# Patient Record
Sex: Male | Born: 1995 | Hispanic: No | Marital: Single | Smoking: Never smoker
Health system: Southern US, Community
[De-identification: ages and names within clinical notes are randomized; demographics above are authoritative.]

## PROBLEM LIST (undated history)

## (undated) DIAGNOSIS — J069 Acute upper respiratory infection, unspecified: Secondary | ICD-10-CM

## (undated) DIAGNOSIS — L309 Dermatitis, unspecified: Secondary | ICD-10-CM

## (undated) HISTORY — DX: Acute upper respiratory infection, unspecified: J06.9

## (undated) HISTORY — DX: Dermatitis, unspecified: L30.9

---
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
## 2016-01-29 ENCOUNTER — Ambulatory Visit
Admission: RE | Admit: 2016-01-29 | Discharge: 2016-01-29 | Disposition: A | Discharge status: Home / Self Care | Payer: 59 | Source: Ambulatory Visit | Attending: Family Medicine | Admitting: Family Medicine

## 2016-01-29 ENCOUNTER — Other Ambulatory Visit: Payer: Self-pay | Admitting: Family Medicine

## 2016-01-29 DIAGNOSIS — S0341XA Sprain of jaw, right side, initial encounter: Secondary | ICD-10-CM

## 2017-11-08 ENCOUNTER — Ambulatory Visit
Admission: RE | Admit: 2017-11-08 | Discharge: 2017-11-08 | Disposition: A | Discharge status: Home / Self Care | Payer: 59 | Source: Ambulatory Visit | Attending: Family Medicine | Admitting: Family Medicine

## 2017-11-08 ENCOUNTER — Other Ambulatory Visit: Payer: Self-pay | Admitting: Family Medicine

## 2017-11-08 DIAGNOSIS — R059 Cough, unspecified: Secondary | ICD-10-CM

## 2017-11-08 DIAGNOSIS — R05 Cough: Secondary | ICD-10-CM

## 2017-12-23 ENCOUNTER — Ambulatory Visit (INDEPENDENT_AMBULATORY_CARE_PROVIDER_SITE_OTHER): Payer: 59 | Admitting: Allergy

## 2017-12-23 ENCOUNTER — Encounter: Payer: Self-pay | Admitting: Allergy

## 2017-12-23 VITALS — BP 120/90 | HR 97 | Temp 98.0°F | Resp 16 | Ht 68.0 in | Wt 143.2 lb

## 2017-12-23 DIAGNOSIS — L2089 Other atopic dermatitis: Secondary | ICD-10-CM | POA: Diagnosis not present

## 2017-12-23 DIAGNOSIS — H1010 Acute atopic conjunctivitis, unspecified eye: Secondary | ICD-10-CM | POA: Diagnosis not present

## 2017-12-23 DIAGNOSIS — J45991 Cough variant asthma: Secondary | ICD-10-CM | POA: Diagnosis not present

## 2017-12-23 DIAGNOSIS — J309 Allergic rhinitis, unspecified: Secondary | ICD-10-CM

## 2017-12-23 DIAGNOSIS — K219 Gastro-esophageal reflux disease without esophagitis: Secondary | ICD-10-CM

## 2017-12-23 MED ORDER — TRIAMCINOLONE ACETONIDE 0.1 % EX OINT: 1.0000 "application " | TOPICAL_OINTMENT | Freq: Two times a day (BID) | CUTANEOUS | 5 refills | Status: AC

## 2017-12-23 MED ORDER — AZELASTINE-FLUTICASONE 137-50 MCG/ACT NA SUSP: 1.0000 | Freq: Two times a day (BID) | NASAL | 5 refills | Status: AC

## 2017-12-23 MED ORDER — MONTELUKAST SODIUM 10 MG PO TABS: 10.0000 mg | ORAL_TABLET | Freq: Every day | ORAL | 5 refills | Status: AC

## 2017-12-23 MED ORDER — ALBUTEROL SULFATE HFA 108 (90 BASE) MCG/ACT IN AERS: 2.0000 | INHALATION_SPRAY | RESPIRATORY_TRACT | 1 refills | Status: AC | PRN

## 2017-12-23 NOTE — Progress Notes (Signed)
New Patient Note  RE: Alfonso RamusRahul Grandt MRN: [id number] DOB: 1995/11/02 Date of Office Visit: 12/23/2017  Referring provider: Tally JoeSwayne, David, MD Primary care provider: Tally JoeSwayne, David, MD  Chief Complaint: Cough  History of present illness: Doral Greggory StallionGeorge is a 22 y.o. male presenting today for consultation for chronic cough and hoarseness.  For the past 2-3 years he has been experiencing a lingering cough.  He also reports post-nasal drip.  He does feel like it is a little bit harder to breathe at times and also has noted wheeze and chest tightness. Also notes a hoarse voice.  The symptoms are slightly worse in the mornings but can occur throughout the day.  The cough is dry and nonproductive.  He sometimes this would like there is thick mucus in the back of his throat that is hard to clear.  No nighttime awakenings.  He has not tried use of albuterol to see if it would relieve the difficulty breathing, chest tightness and wheeze. He states he sleeps elevated to help with the drainage.  It was recommended by his PCP to take nexium daily as he does have reflux.   He has also taking zyrtec daily and using Flonase 2 sprays each nostril daily.  He has been off Zyrtec for last week and does not feel he has noted any worsening of symptoms.  He states he has been on Zyrtec for years.  He does report symptoms of watery eyes, sneezing and congestion with pollen exposure.  He states he had skin testing in middle school and recalls being positive to pollens and cat.  He has never undergone immunotherapy.  He denies any history of food allergy.  He does have history of eczema but states it was much worse when he was younger.     Review of systems: Review of Systems  Constitutional: Negative for chills, fever and malaise/fatigue.  HENT: Positive for congestion. Negative for ear discharge, ear pain, nosebleeds, sinus pain and sore throat.   Eyes: Negative for pain, discharge and redness.  Respiratory:  Positive for cough, shortness of breath and wheezing. Negative for hemoptysis and sputum production.   Cardiovascular: Negative for chest pain.  Gastrointestinal: Negative for abdominal pain, constipation, diarrhea, heartburn, nausea and vomiting.  Musculoskeletal: Negative for joint pain.  Skin: Positive for itching and rash.  Neurological: Negative for headaches.    All other systems negative unless noted above in HPI  Past medical history: Past Medical History:  Diagnosis Date  . Eczema   . Recurrent upper respiratory infection (URI)     Past surgical history: History reviewed. No pertinent surgical history.  Family history:  Family History  Problem Relation Age of Onset  . Asthma Father   . Eczema Father     Social history: He lives in a home with carpeting with gas heating and central cooling.  There are no pets in the home.  There is no concern for water damage, mildew or roaches in the home.  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  He denies any smoking history  Medication List: Allergies as of 12/23/2017      Reactions   Penicillins       Medication List        Accurate as of 12/23/17  5:00 PM. Always use your most recent med list.          cetirizine 10 MG tablet Commonly known as:  ZYRTEC Take 10 mg by mouth daily.   esomeprazole 20  MG capsule Commonly known as:  NEXIUM Take 20 mg by mouth daily at 12 noon.   fluticasone 50 MCG/ACT nasal spray Commonly known as:  FLONASE Place 2 sprays into both nostrils daily.       Known medication allergies: Allergies  Allergen Reactions  . Penicillins      Physical examination: Blood pressure 120/90, pulse 97, temperature 98 F (36.7 C), resp. rate 16, height 5\' 8"  (1.727 m), weight 143 lb 3.2 oz (65 kg), SpO2 96 %.  General: Alert, interactive, in no acute distress. HEENT: PERRLA, TMs pearly gray, turbinates moderately edematous with clear discharge, post-pharynx non  erythematous. Neck: Supple without lymphadenopathy. Lungs: Clear to auscultation without wheezing, rhonchi or rales. {no increased work of breathing. CV: Normal S1, S2 without murmurs. Abdomen: Nondistended, nontender. Skin: Dry, erythematous, excoriated patches on the Left axilla extending posteriorly to the back.  Skin is overall dry and feels lichenified on the back. Extremities:  No clubbing, cyanosis or edema. Neuro:   Grossly intact.  Diagnositics/Labs:  Spirometry: FEV1: 2.95L 67%, FVC: 3.65L 70% postbronchodilator she did not have any significant improvement.  This was his first attempt at spirometry.  He did not have great effort on either pre-or post attempts.  Allergy testing: Environmental allergy skin prick testing is positive to grasses, weeds, trees, molds, dust mites, cat, cockroach Allergy testing results were read and interpreted by provider, documented by clinical staff.   Assessment and plan:   Allergic rhinoconjunctivitis   - environmental allergy skin testing today is positive to grasses, weeds, trees, mold, dust mites, cat and cockroach   - allergen avoidance measures provided today.  Recommend performing avoidance measures to decrease exposure and symptoms   - stop zyrtec and trial either Allegra 180mg  or Xyzal 5mg  daily   - trial dymista 1 spray each nostril twice a day.  This is a combination nasal spray with Flonase + Astelin (nasal antihistamine).  This helps with both nasal congestion and drainage.  dymista is not well covered by insurance and thus if not covered will prescribe Astelin separately as a prescription (Astelin 2 sprays each nostril twice a day).     - start singulair 10mg  daily - take in evening/bedtime   - you are eligible for allergen immunotherapy if above medications do not control symptoms well-enough.  We can discuss this at a future visit.   Cough   - cough is likely multifactorial with post-nasal drainage and reflux being contributors as  well as likely allergic or cough variant asthma with difficulty breathing, wheeze and chest tightness.     - have access to albuterol inhaler 2 puffs every 4-6 hours as needed for cough/wheeze/shortness of breath/chest tightness.  May use 15-20 minutes prior to activity.   Monitor frequency of use.     - as above start singulair daily  Reflux   - continue daily use of Nexium   - avoid foods that worsening reflux   - continue increase head of bed to help decrease reflux symptoms  Eczema    - daily moisturization is key with emollients like Aquafor, Eucerin, CeraVe, Vaseline   - can use triamcinolone ointment to dry/patchy/red/inflamed areas twice a day until improved  Follow-up 3 months or sooner if needed  I appreciate the opportunity to take part in Furqan's care. Please do not hesitate to contact me with questions.  Sincerely,   Margo Aye, MD Allergy/Immunology [facility]

## 2017-12-23 NOTE — Patient Instructions (Signed)
Allergic rhinoconjunctivitis   - environmental allergy skin testing today is positive to grasses, weeds, trees, mold, dust mites, cat and cockroach   - allergen avoidance measures provided today.  Recommend performing avoidance measures to decrease exposure and symptoms   - stop zyrtec and trial either Allegra 180mg  or Xyzal 5mg  daily   - trial dymista 1 spray each nostril twice a day.  This is a combination nasal spray with Flonase + Astelin (nasal antihistamine).  This helps with both nasal congestion and drainage.  dymista is not well covered by insurance and thus if not covered will prescribe Astelin separately as a prescription (Astelin 2 sprays each nostril twice a day).     - start singulair 10mg  daily - take in evening/bedtime   - you are eligible for allergen immunotherapy if above medications do not control symptoms well-enough.  We can discuss this at a future visit.   Cough   - cough is likely multifactorial with post-nasal drainage and reflux being contributors as well as likely allergic asthma with difficulty breathing, wheeze and chest tightness.     - have access to albuterol inhaler 2 puffs every 4-6 hours as needed for cough/wheeze/shortness of breath/chest tightness.  May use 15-20 minutes prior to activity.   Monitor frequency of use.     - as above start singulair daily  Reflux   - continue daily use of Nexium   - avoid foods that worsening reflux   - continue increase head of bed to help decrease reflux symptoms  Eczema    - daily moisturization is key with emollients like Aquafor, Eucerin, CeraVe, Vaseline   - can use triamcinolone ointment to dry/patchy/red/inflamed areas twice a day until improved  Follow-up 3 months or sooner if needed

## 2018-01-26 ENCOUNTER — Encounter: Payer: Self-pay | Admitting: Allergy

## 2020-03-01 IMAGING — CR DG CHEST 2V
2 series · 2 of 2 positions shown · non-contrast
Comparison: None.

CLINICAL DATA: Productive cough for 1 year.

EXAM:
CHEST - 2 VIEW

[w chest pa]
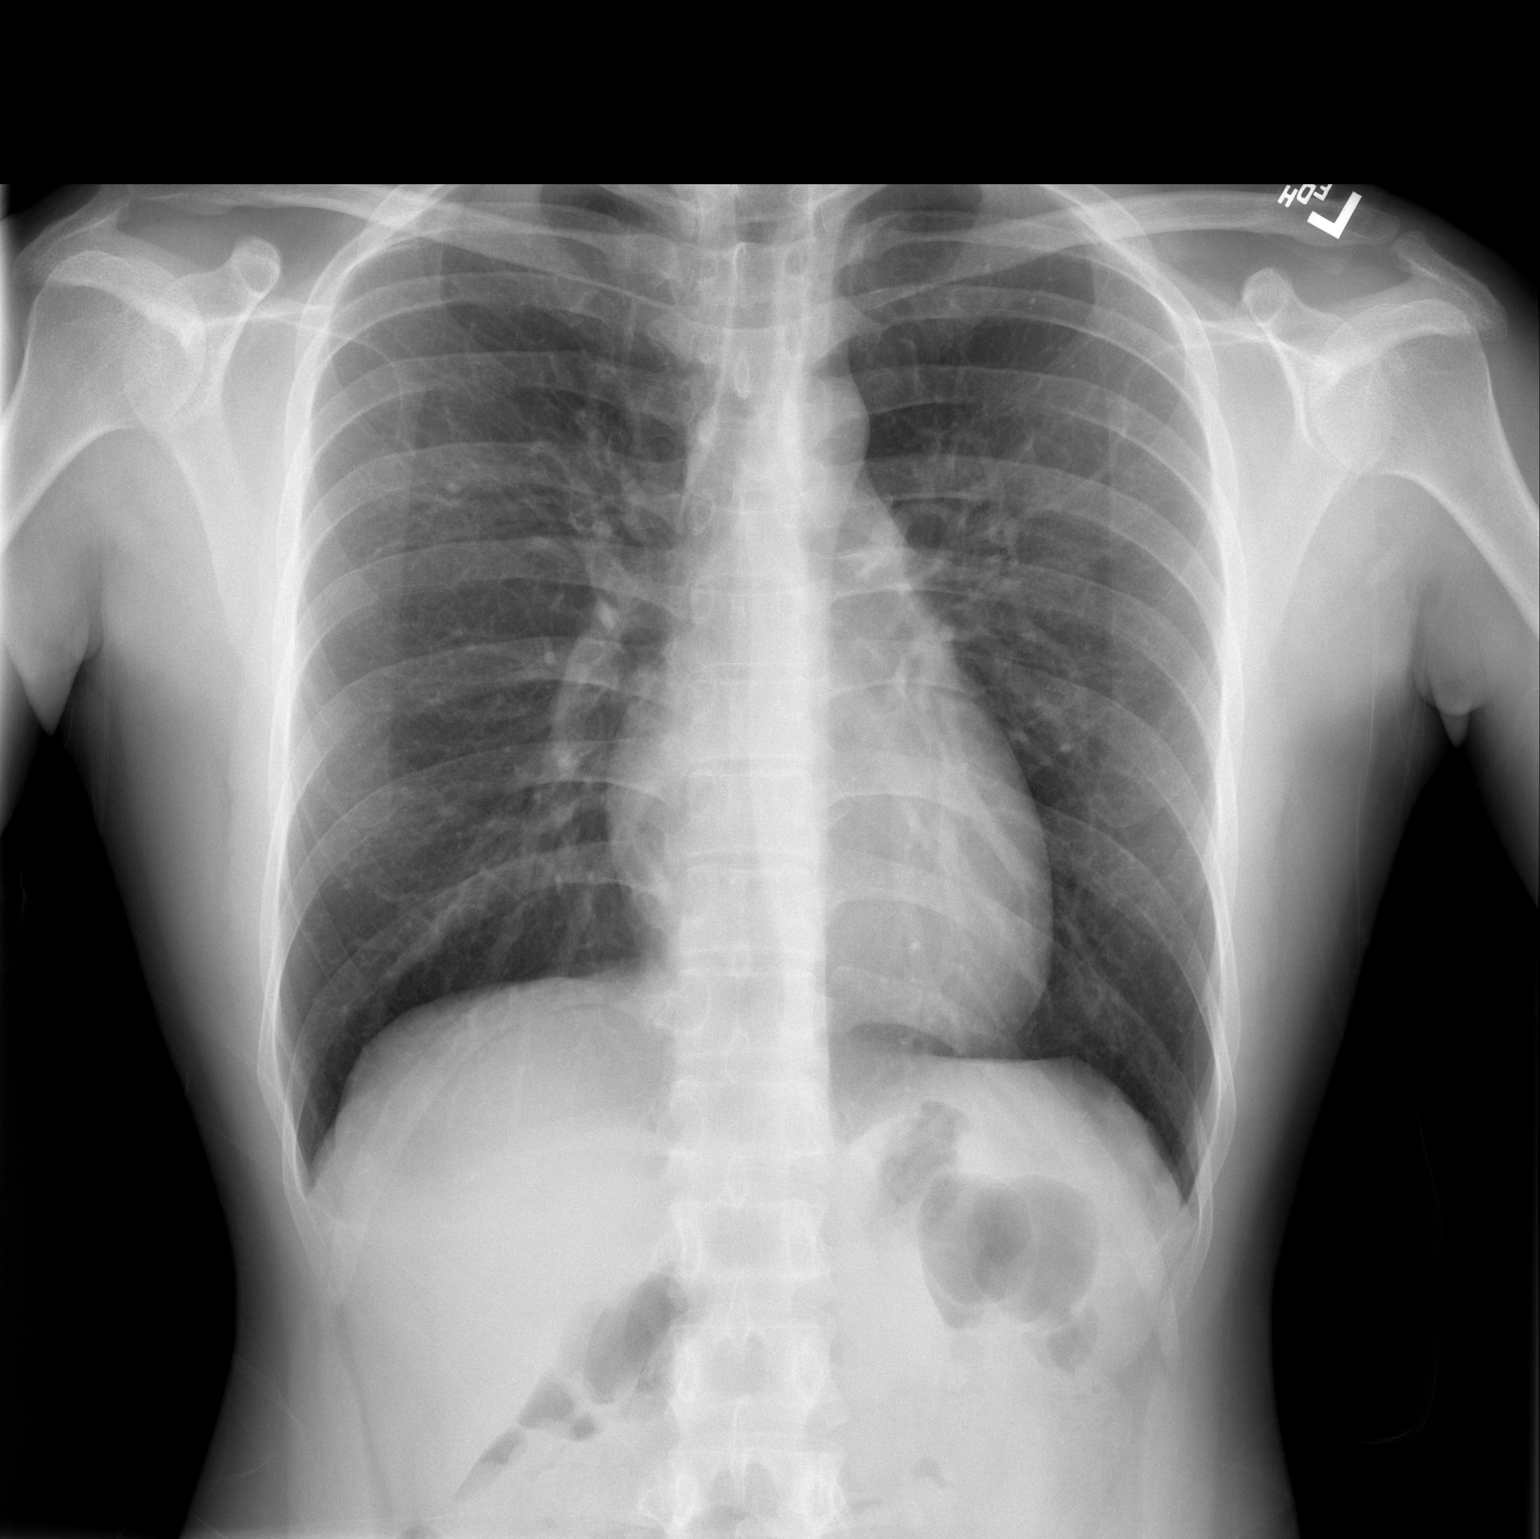

[w chest lat]
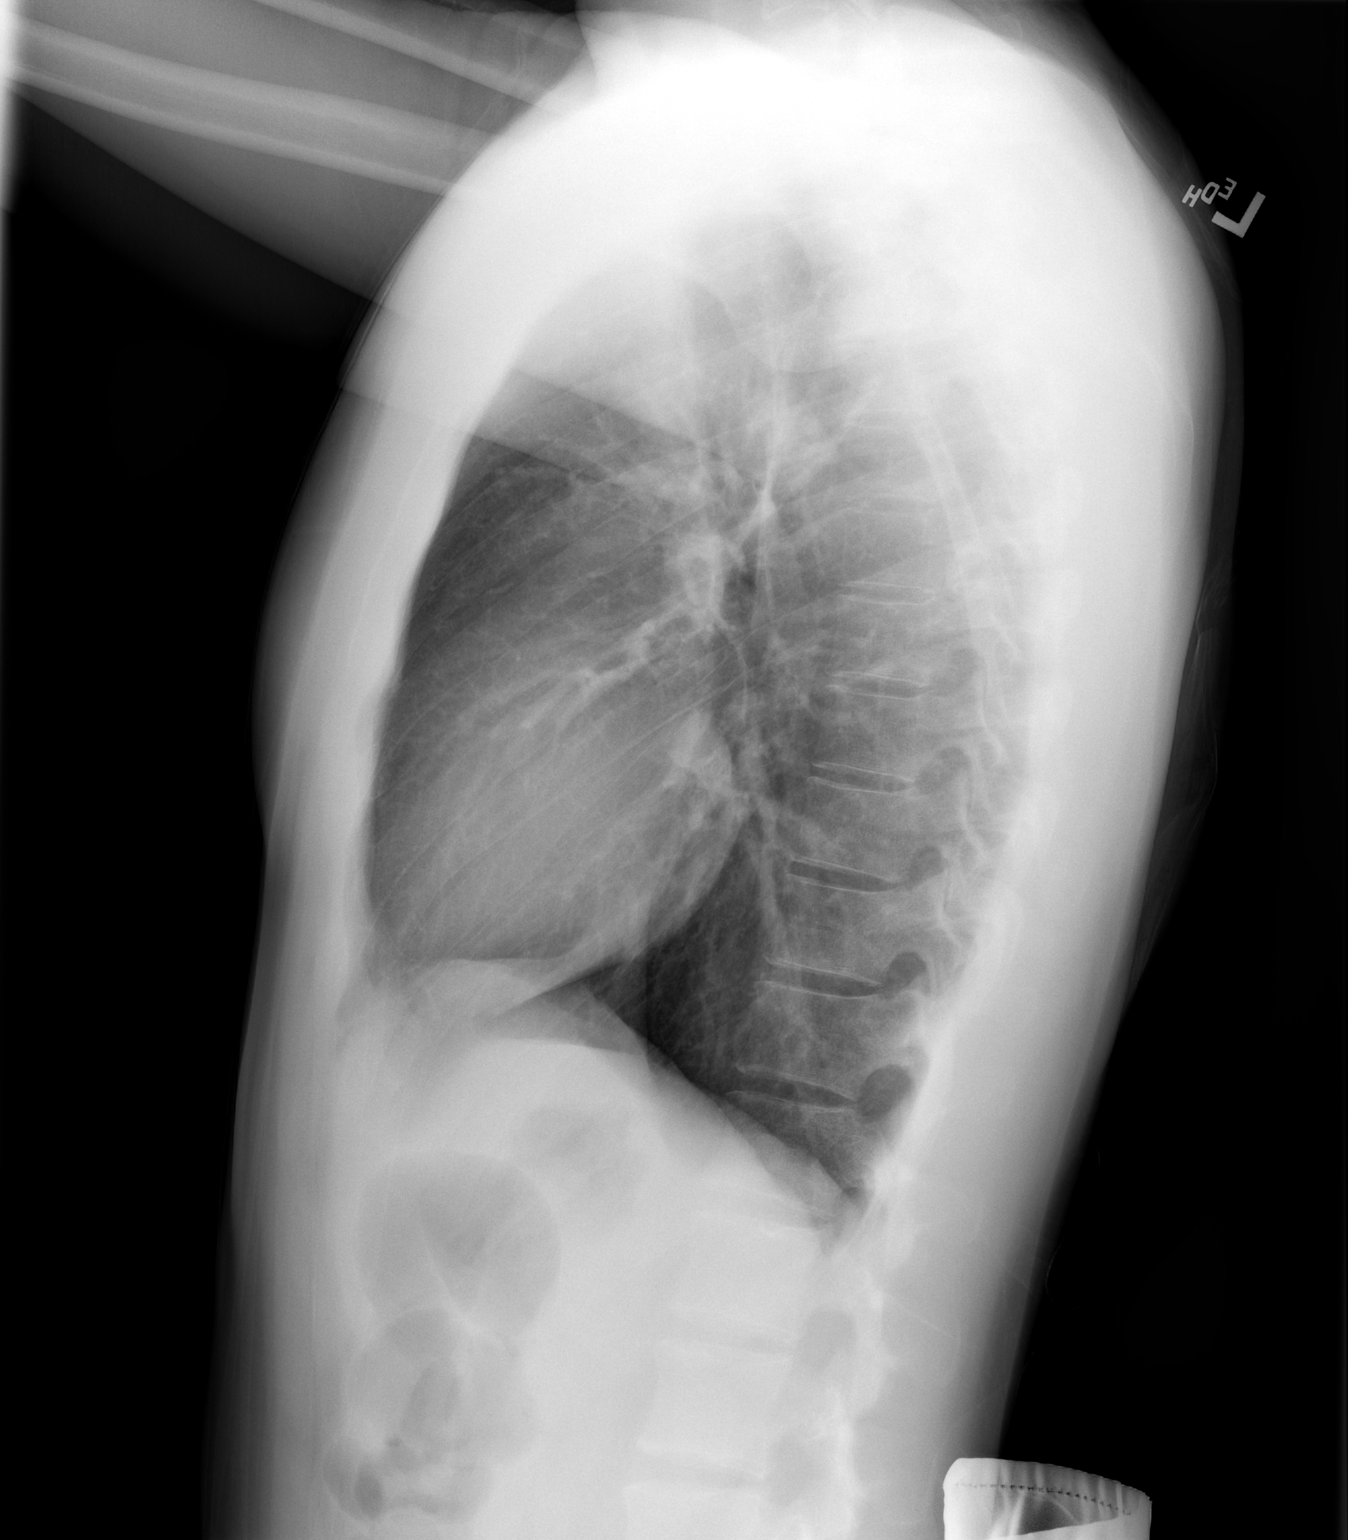

[2 of 2 positions shown; findings below may reference images not displayed]

FINDINGS: The cardiomediastinal contours are normal. The lungs are clear.
Pulmonary vasculature is normal. No consolidation, pleural effusion,
or pneumothorax. No acute osseous abnormalities are seen.
IMPRESSION: Negative radiographs of the chest.
